# Patient Record
Sex: Male | Born: 1980 | Race: White | Hispanic: No | Marital: Single | Smoking: Never smoker
Health system: Southern US, Community
[De-identification: ages and names within clinical notes are randomized; demographics above are authoritative.]

## PROBLEM LIST (undated history)

## (undated) HISTORY — PX: HERNIA REPAIR: SHX51

---

## 2013-04-29 ENCOUNTER — Emergency Department (HOSPITAL_BASED_OUTPATIENT_CLINIC_OR_DEPARTMENT_OTHER)
Admission: EM | Admit: 2013-04-29 | Discharge: 2013-04-29 | Disposition: A | Discharge status: Home / Self Care | Payer: BC Managed Care – PPO | Attending: Emergency Medicine | Admitting: Emergency Medicine

## 2013-04-29 ENCOUNTER — Encounter (HOSPITAL_BASED_OUTPATIENT_CLINIC_OR_DEPARTMENT_OTHER): Payer: Self-pay | Admitting: Emergency Medicine

## 2013-04-29 DIAGNOSIS — R197 Diarrhea, unspecified: Secondary | ICD-10-CM | POA: Insufficient documentation

## 2013-04-29 DIAGNOSIS — M549 Dorsalgia, unspecified: Secondary | ICD-10-CM | POA: Insufficient documentation

## 2013-04-29 DIAGNOSIS — R61 Generalized hyperhidrosis: Secondary | ICD-10-CM | POA: Insufficient documentation

## 2013-04-29 DIAGNOSIS — K8020 Calculus of gallbladder without cholecystitis without obstruction: Secondary | ICD-10-CM | POA: Insufficient documentation

## 2013-04-29 DIAGNOSIS — R110 Nausea: Secondary | ICD-10-CM | POA: Insufficient documentation

## 2013-04-29 DIAGNOSIS — R0789 Other chest pain: Secondary | ICD-10-CM | POA: Insufficient documentation

## 2013-04-29 DIAGNOSIS — R1013 Epigastric pain: Secondary | ICD-10-CM | POA: Insufficient documentation

## 2013-04-29 DIAGNOSIS — K8050 Calculus of bile duct without cholangitis or cholecystitis without obstruction: Secondary | ICD-10-CM

## 2013-04-29 LAB — CBC WITH DIFFERENTIAL/PLATELET
Basophils Absolute: 0 10*3/uL (ref 0.0–0.1)
Eosinophils Relative: 2 % (ref 0–5)
HCT: 48.9 % (ref 39.0–52.0)
Hemoglobin: 16.2 g/dL (ref 13.0–17.0)
Lymphocytes Relative: 20 % (ref 12–46)
Lymphs Abs: 1.9 10*3/uL (ref 0.7–4.0)
MCHC: 33.1 g/dL (ref 30.0–36.0)
MCV: 96.1 fL (ref 78.0–100.0)
Monocytes Absolute: 1.2 10*3/uL — ABNORMAL HIGH (ref 0.1–1.0)
Monocytes Relative: 12 % (ref 3–12)
Neutro Abs: 6.5 10*3/uL (ref 1.7–7.7)
RBC: 5.09 MIL/uL (ref 4.22–5.81)
RDW: 12.5 % (ref 11.5–15.5)
WBC: 9.8 10*3/uL (ref 4.0–10.5)

## 2013-04-29 LAB — COMPREHENSIVE METABOLIC PANEL
AST: 28 U/L (ref 0–37)
Alkaline Phosphatase: 67 U/L (ref 39–117)
BUN: 12 mg/dL (ref 6–23)
CO2: 28 mEq/L (ref 19–32)
Calcium: 9.5 mg/dL (ref 8.4–10.5)
Chloride: 102 mEq/L (ref 96–112)
Creatinine, Ser: 1.1 mg/dL (ref 0.50–1.35)
GFR calc Af Amer: >90 mL/min (ref >90)
GFR calc non Af Amer: 89 mL/min — ABNORMAL LOW (ref >90)
Glucose, Bld: 113 mg/dL — ABNORMAL HIGH (ref 70–99)
Potassium: 4.4 mEq/L (ref 3.5–5.1)
Total Bilirubin: 0.4 mg/dL (ref 0.3–1.2)

## 2013-04-29 LAB — TROPONIN I: Troponin I: <0.3 ng/mL (ref <0.30)

## 2013-04-29 MED ORDER — ONDANSETRON HCL 4 MG/2ML IJ SOLN
4.0000 mg | Freq: Once | INTRAMUSCULAR | Status: AC
  Administered 2013-04-29: 4 mg via INTRAVENOUS
  Filled 2013-04-29: qty 2

## 2013-04-29 MED ORDER — HYDROMORPHONE HCL PF 1 MG/ML IJ SOLN
1.0000 mg | Freq: Once | INTRAMUSCULAR | Status: AC
  Administered 2013-04-29: 1 mg via INTRAVENOUS
  Filled 2013-04-29: qty 1

## 2013-04-29 MED ORDER — SODIUM CHLORIDE 0.9 % IV BOLUS (SEPSIS)
1000.0000 mL | Freq: Once | INTRAVENOUS | Status: AC
  Administered 2013-04-29: 1000 mL via INTRAVENOUS

## 2013-04-29 NOTE — ED Provider Notes (Signed)
CSN: [id number]     Arrival date & time 04/29/13  1917 History   First MD Initiated Contact with Patient 04/29/13 1933     Chief Complaint  Patient presents with  . Chest Pain   (Consider location/radiation/quality/duration/timing/severity/associated sxs/prior Treatment) Patient is a 32 y.o. male presenting with chest pain. The history is provided by the patient.  Chest Pain Associated symptoms: abdominal pain, back pain, diaphoresis and nausea   Associated symptoms: no cough, no fever, no shortness of breath and not vomiting   Patient presents with acute onset epigastric pain approximately "7 minutes" after eating a pork roast. Pain is sharp, radiates between the shoulder blades and to the chest. Pain is intermittent and "comes in waves" and rated as a 6/10. Admits to nausea and diarrhea. Denies vomiting. Reports similar experience in October after eating [employer]. Denies Heartburn or indigestion.   Patient PMH is not significant for any cardiac disease. Denies any medical problems. Denies alcohol use History reviewed. No pertinent past medical history. Past Surgical History  Procedure Laterality Date  . Hernia repair     No family history on file. History  Substance Use Topics  . Smoking status: Never Smoker   . Smokeless tobacco: Never Used  . Alcohol Use: No    Review of Systems  Constitutional: Positive for diaphoresis. Negative for fever.  Respiratory: Negative for cough, shortness of breath and wheezing.   Cardiovascular: Positive for chest pain.  Gastrointestinal: Positive for nausea, abdominal pain and diarrhea. Negative for vomiting, constipation and blood in stool.  Genitourinary: Negative for dysuria, hematuria and flank pain.  Musculoskeletal: Positive for back pain. Negative for neck pain and neck stiffness.  All other systems reviewed and are negative.    Allergies  Review of patient's allergies indicates no known allergies.  Home Medications  No current  outpatient prescriptions on file. BP 109/38  Pulse 57  Temp(Src) 98.6 F (37 C) (Oral)  Resp 12  Ht 5\' 10"  (1.778 m)  Wt 250 lb (113.399 kg)  BMI 35.87 kg/m2  SpO2 100% Physical Exam  Constitutional: He is oriented to person, place, and time. He appears well-developed and well-nourished. No distress.  HENT:  Head: Normocephalic and atraumatic.  Neck: Normal range of motion. Neck supple.  Cardiovascular: Normal rate, regular rhythm and normal heart sounds.  Exam reveals no gallop and no friction rub.   No murmur heard. Pulmonary/Chest: Effort normal and breath sounds normal. No respiratory distress. He has no wheezes. He has no rales. He exhibits no tenderness.  Abdominal: Soft. Bowel sounds are normal. He exhibits no distension and no mass. There is tenderness. There is no rigidity, no rebound, no guarding, no tenderness at McBurney's point and negative Murphy's sign.  Musculoskeletal: Normal range of motion.  Neurological: He is alert and oriented to person, place, and time.  Skin: Skin is warm and dry.  Psychiatric: He has a normal mood and affect. His behavior is normal. Thought content normal.    ED Course  Procedures (including critical care time) Labs Review Labs Reviewed  CBC WITH DIFFERENTIAL - Abnormal; Notable for the following:    Monocytes Absolute 1.2 (*)    All other components within normal limits  COMPREHENSIVE METABOLIC PANEL - Abnormal; Notable for the following:    Glucose, Bld 113 (*)    GFR calc non Af Amer 89 (*)    All other components within normal limits  LIPASE, BLOOD  TROPONIN I   Imaging Review No results found.  EKG Interpretation  Date/Time:  Saturday April 29 2013 19:26:24 EST Ventricular Rate:  54 PR Interval:  166 QRS Duration: 116 QT Interval:  426 QTC Calculation: 403 R Axis:   61 Text Interpretation:  Sinus bradycardia with sinus arrhythmia Incomplete right bundle branch block Borderline ECG No old tracing to compare  Confirmed by [facility]  MD, CHARLES [phone]) on 04/29/2013 7:57:19 PM            MDM   1. Biliary colic   Patient presents with abdominal pain radiating between shoulders and chest, directly following eating. Patient appears to be in no acute distress. Negative cardiac hx. All lab work unremarkable. Troponin Negative. CXR shows no signs of MI. Suspect biliary colic. Discussed in detail with patient possible causes of his pain. Advised to follow up with PCP at [facility]. Patient's pain was well controlled, discharged to go home with father. Pain at discharge reported as 0/10.     Rudene Anda, PA-C 04/30/13 [phone]

## 2013-04-29 NOTE — ED Notes (Signed)
EDP Bernette Mayers given EKG- no old for comparison

## 2013-04-29 NOTE — ED Notes (Addendum)
Pt reports chest/abd pain radiating to back with diaphoresis starting after eating- pt reports he had similar episode in October- pt ?food allergy

## 2013-04-30 NOTE — ED Provider Notes (Signed)
Medical screening examination/treatment/procedure(s) were conducted as a shared visit with non-physician practitioner(s) and myself.  I personally evaluated the patient during the encounter.  EKG Interpretation    Date/Time:  Saturday April 29 2013 19:26:24 EST Ventricular Rate:  54 PR Interval:  166 QRS Duration: 116 QT Interval:  426 QTC Calculation: 403 R Axis:   61 Text Interpretation:  Sinus bradycardia with sinus arrhythmia Incomplete right bundle branch block Borderline ECG No old tracing to compare Confirmed by [facility]  MD, CHARLES [phone]) on 04/29/2013 7:57:19 PM            PT with epigastric pain, radiating into back and chest after eating. No concern for ACS. Labs unremarkable. Limited bedside ultrasound was inconclusive due to overlying bowel gas, no gall bladder images obtained. Advised low fat diet and PCP Followup  Charles B. Bernette Mayers, MD 04/30/13 [phone]

## 2013-06-26 ENCOUNTER — Other Ambulatory Visit: Payer: Self-pay | Admitting: Family Medicine

## 2013-06-26 DIAGNOSIS — K8050 Calculus of bile duct without cholangitis or cholecystitis without obstruction: Secondary | ICD-10-CM

## 2013-06-30 ENCOUNTER — Ambulatory Visit
Admission: RE | Admit: 2013-06-30 | Discharge: 2013-06-30 | Disposition: A | Discharge status: Home / Self Care | Payer: BC Managed Care – PPO | Source: Ambulatory Visit | Attending: Family Medicine | Admitting: Family Medicine

## 2013-06-30 DIAGNOSIS — K8050 Calculus of bile duct without cholangitis or cholecystitis without obstruction: Secondary | ICD-10-CM

## 2015-07-04 IMAGING — US US ABDOMEN COMPLETE
1 series · 14 of 25 positions shown · non-contrast
Comparison: None.

CLINICAL DATA: Evaluate for cause of postprandial abdominal pain.

EXAM:
ULTRASOUND ABDOMEN COMPLETE

[Series 1: us abdomen complete · 0.30mm/px · 14 of 68 slices shown]
[im 1/68]
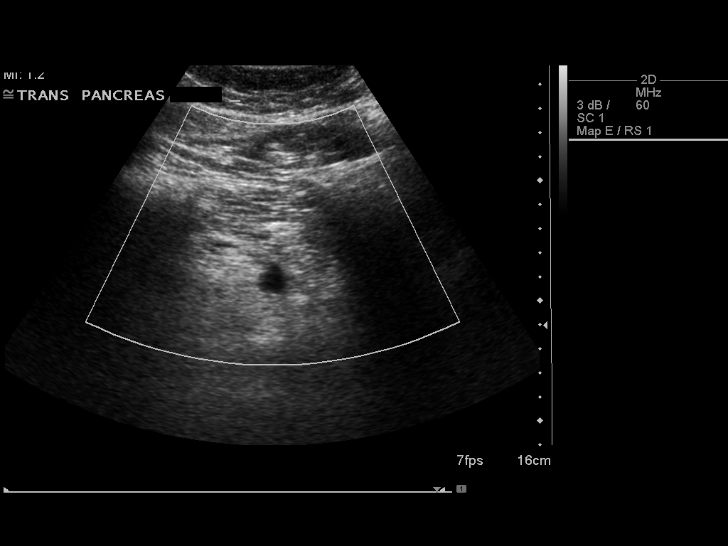
[im 6/68]
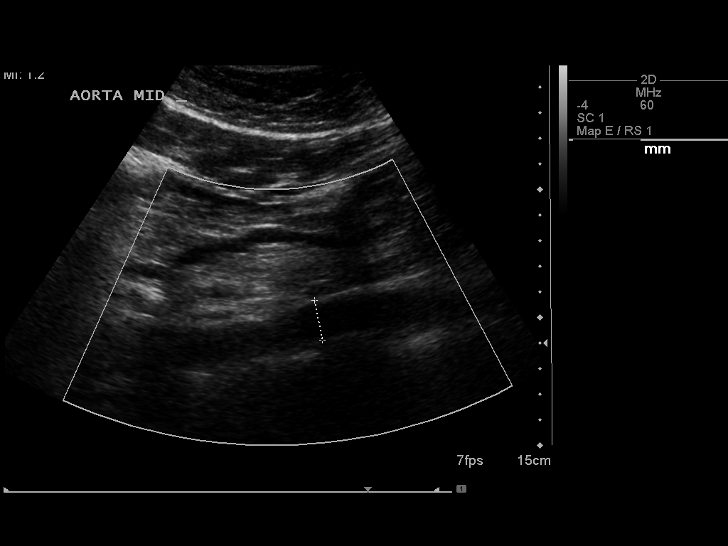
[im 12/68]
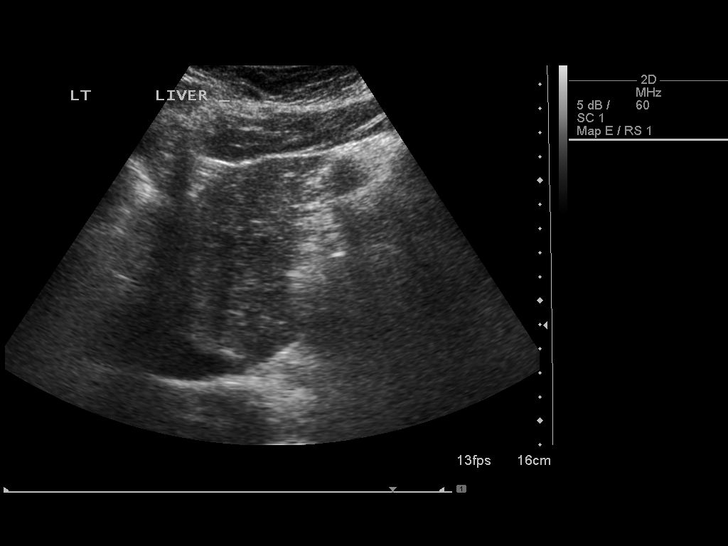
[im 17/68]
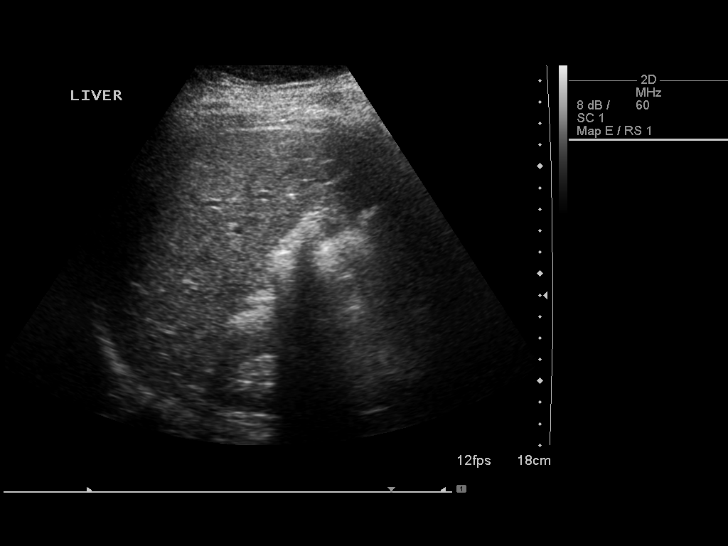
[im 23/68]
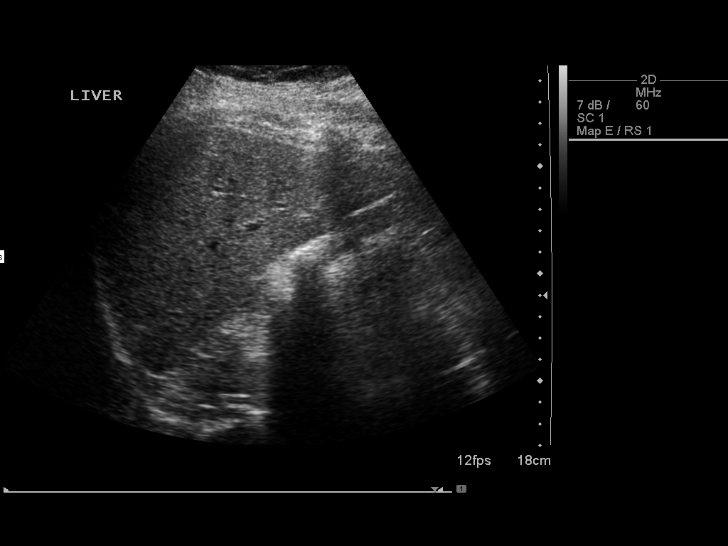
[im 26/68]
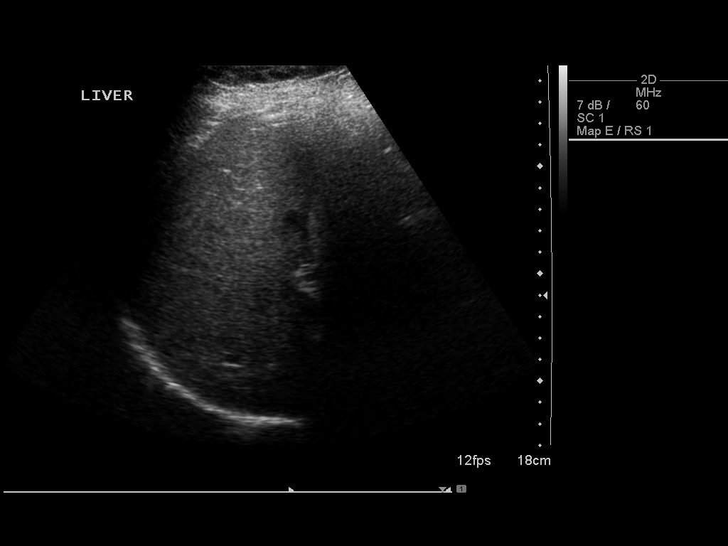
[im 31/68]
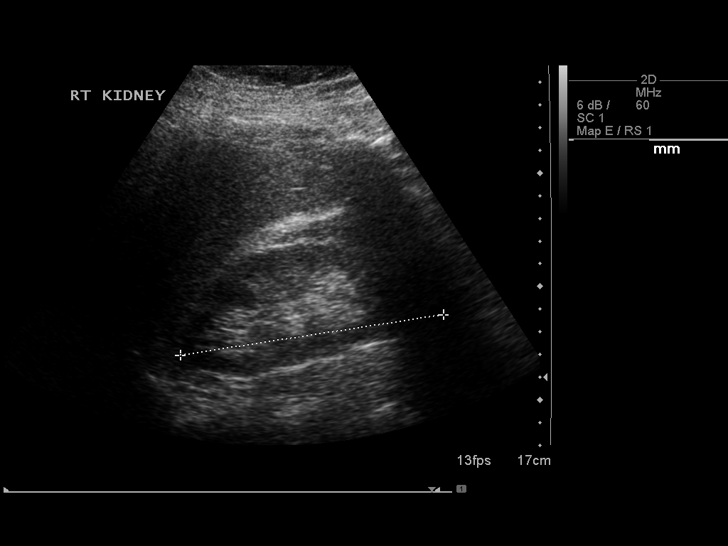
[im 37/68]
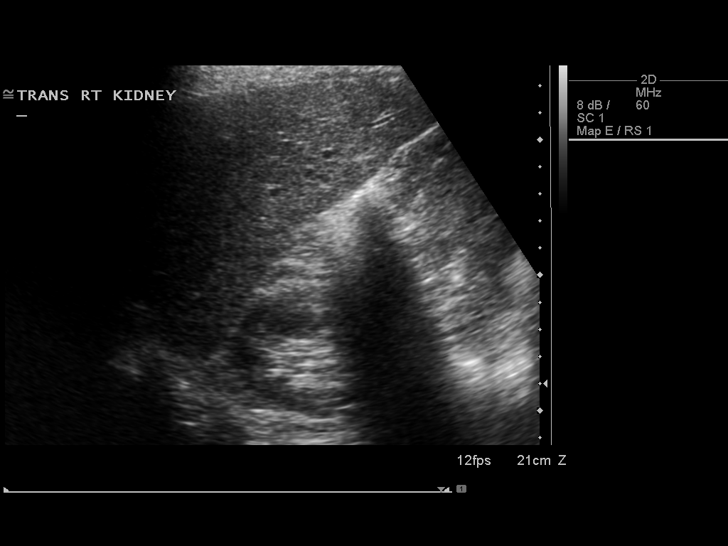
[im 42/68]
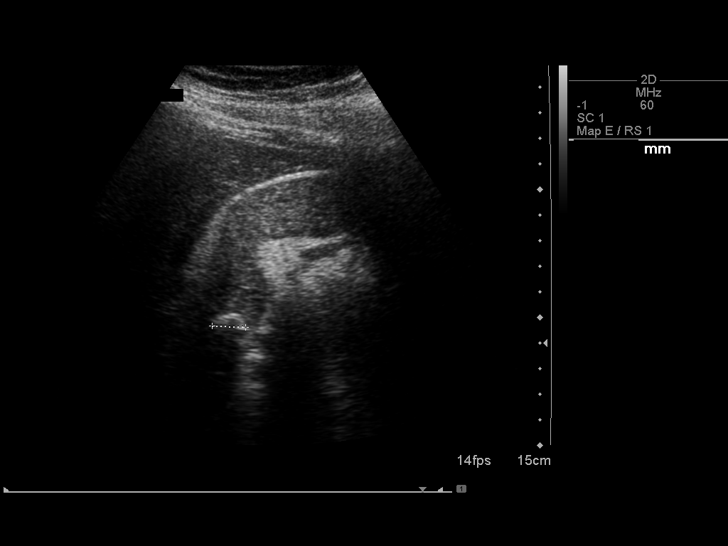
[im 45/68]
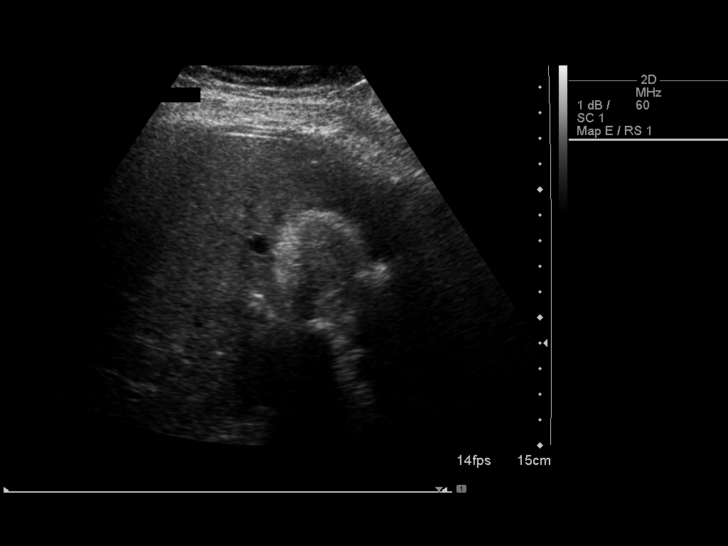
[im 51/68]
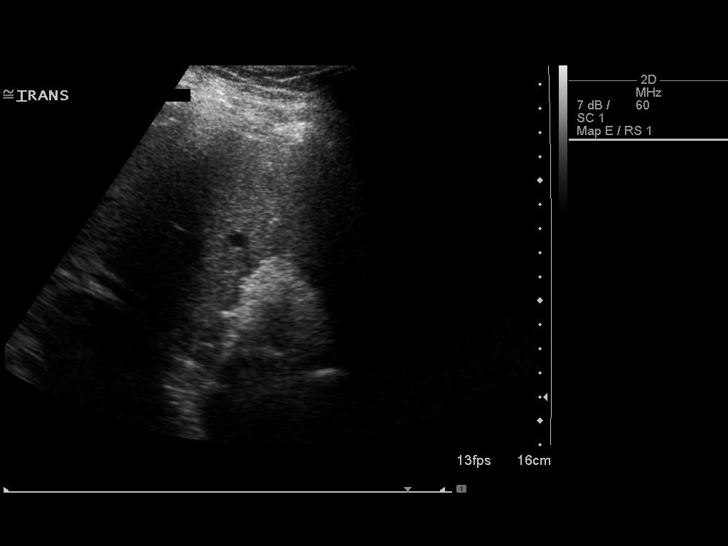
[im 56/68]
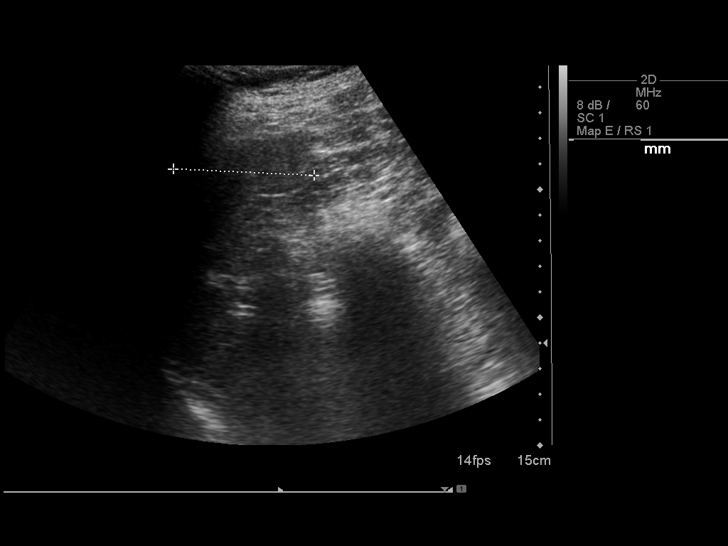
[im 62/68]
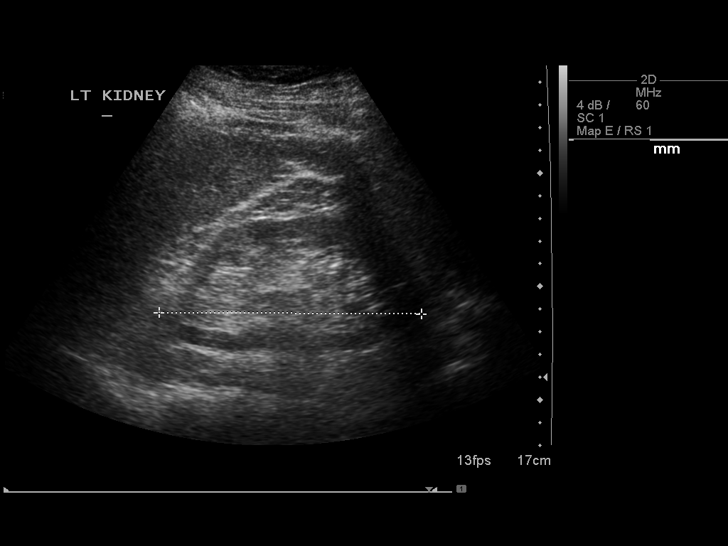
[im 68/68]
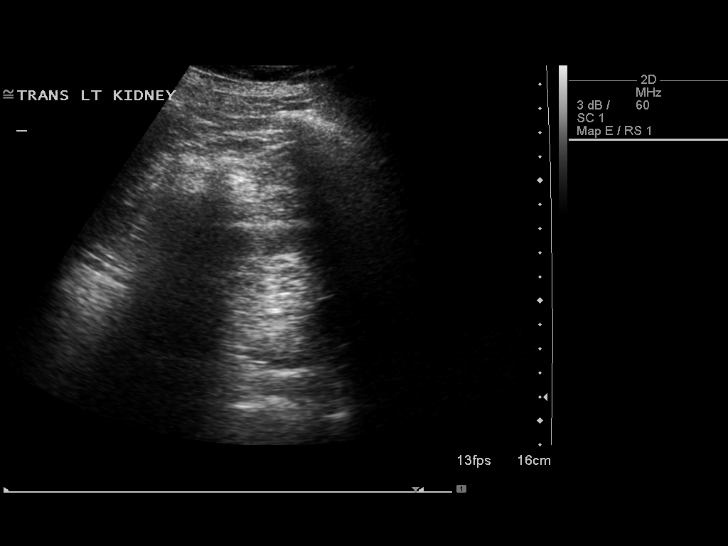

[14 of 25 positions shown; findings below may reference images not displayed]

FINDINGS: Gallbladder:

There is sludge within the gallbladder. 1.3 cm gallstone is within
the gallbladder neck an non mobile. No gallbladder wall thickening,
measuring 2 mm. Negative sonographic Buy.

Common bile duct:

Diameter: Normal caliber, 3 mm.

Liver:

No focal lesion identified. Within normal limits in parenchymal
echogenicity.

IVC:

No abnormality visualized.

Pancreas:

Not well visualized due to overlying bowel gas.

Spleen:

Size and appearance within normal limits.

Right Kidney:

Length: 11.7 cm. Echogenicity within normal limits. No mass or
hydronephrosis visualized.

Left Kidney:

Length: 11.6 cm. Echogenicity within normal limits. No mass or
hydronephrosis visualized.

Abdominal aorta:

No aneurysm visualized.

Other findings:

None.
IMPRESSION: 1.3 cm non mobile stone in the region of the gallbladder neck.
Gallbladder is filled with sludge. No wall thickening or sonographic
Murphy's sign.
